# Patient Record
Sex: Female | Born: 1970 | Race: White | Hispanic: No | Smoking: Former smoker
Health system: Southern US, Community
[De-identification: ages and names within clinical notes are randomized; demographics above are authoritative.]

## PROBLEM LIST (undated history)

## (undated) DIAGNOSIS — E079 Disorder of thyroid, unspecified: Secondary | ICD-10-CM

## (undated) DIAGNOSIS — E039 Hypothyroidism, unspecified: Secondary | ICD-10-CM

## (undated) DIAGNOSIS — K219 Gastro-esophageal reflux disease without esophagitis: Secondary | ICD-10-CM

## (undated) DIAGNOSIS — F329 Major depressive disorder, single episode, unspecified: Secondary | ICD-10-CM

## (undated) DIAGNOSIS — J45909 Unspecified asthma, uncomplicated: Secondary | ICD-10-CM

## (undated) DIAGNOSIS — F419 Anxiety disorder, unspecified: Secondary | ICD-10-CM

## (undated) DIAGNOSIS — F32A Depression, unspecified: Secondary | ICD-10-CM

## (undated) HISTORY — PX: ABDOMINAL HYSTERECTOMY: SHX81

## (undated) HISTORY — DX: Depression, unspecified: F32.A

## (undated) HISTORY — DX: Major depressive disorder, single episode, unspecified: F32.9

## (undated) HISTORY — DX: Gastro-esophageal reflux disease without esophagitis: K21.9

## (undated) HISTORY — DX: Anxiety disorder, unspecified: F41.9

---

## 2012-03-18 ENCOUNTER — Other Ambulatory Visit: Payer: Self-pay | Admitting: Obstetrics

## 2012-03-18 DIAGNOSIS — R102 Pelvic and perineal pain: Secondary | ICD-10-CM

## 2012-03-23 ENCOUNTER — Ambulatory Visit (HOSPITAL_COMMUNITY)
Admission: RE | Admit: 2012-03-23 | Discharge: 2012-03-23 | Disposition: A | Discharge status: Home / Self Care | Payer: Self-pay | Source: Ambulatory Visit | Attending: Obstetrics | Admitting: Obstetrics

## 2012-03-23 DIAGNOSIS — N949 Unspecified condition associated with female genital organs and menstrual cycle: Secondary | ICD-10-CM | POA: Insufficient documentation

## 2012-03-23 DIAGNOSIS — N809 Endometriosis, unspecified: Secondary | ICD-10-CM | POA: Insufficient documentation

## 2012-03-23 DIAGNOSIS — R102 Pelvic and perineal pain: Secondary | ICD-10-CM

## 2012-03-23 DIAGNOSIS — I868 Varicose veins of other specified sites: Secondary | ICD-10-CM | POA: Insufficient documentation

## 2016-10-26 ENCOUNTER — Encounter: Payer: Self-pay | Admitting: Primary Care

## 2016-10-26 ENCOUNTER — Ambulatory Visit (INDEPENDENT_AMBULATORY_CARE_PROVIDER_SITE_OTHER): Payer: BLUE CROSS/BLUE SHIELD | Admitting: Primary Care

## 2016-10-26 ENCOUNTER — Ambulatory Visit (INDEPENDENT_AMBULATORY_CARE_PROVIDER_SITE_OTHER)
Admission: RE | Admit: 2016-10-26 | Discharge: 2016-10-26 | Disposition: A | Discharge status: Home / Self Care | Payer: BLUE CROSS/BLUE SHIELD | Source: Ambulatory Visit | Attending: Primary Care | Admitting: Primary Care

## 2016-10-26 VITALS — BP 120/70 | HR 70 | Temp 98.2°F | Ht 66.5 in | Wt 223.8 lb

## 2016-10-26 DIAGNOSIS — M5441 Lumbago with sciatica, right side: Secondary | ICD-10-CM | POA: Diagnosis not present

## 2016-10-26 DIAGNOSIS — F418 Other specified anxiety disorders: Secondary | ICD-10-CM | POA: Diagnosis not present

## 2016-10-26 DIAGNOSIS — F32A Depression, unspecified: Secondary | ICD-10-CM | POA: Insufficient documentation

## 2016-10-26 DIAGNOSIS — F419 Anxiety disorder, unspecified: Secondary | ICD-10-CM

## 2016-10-26 DIAGNOSIS — F329 Major depressive disorder, single episode, unspecified: Secondary | ICD-10-CM

## 2016-10-26 MED ORDER — METHOCARBAMOL 500 MG PO TABS: 500.0000 mg | ORAL_TABLET | Freq: Three times a day (TID) | ORAL | 0 refills | Status: DC | PRN

## 2016-10-26 NOTE — Patient Instructions (Signed)
Complete xray(s) prior to leaving today.   Stop Cyclobenzaprine muscle relaxer. Start methocarbamol (Robaxin) muscle relaxer. Take 1 tablet by mouth three times daily as needed for muscle spasms.  Continue dexamethasone steroid. Do not take any ibuprofen, motrin, aleve, advil while taking this medication.  Apply a heating pad to your lower back when possible.  I will be in touch with you as soon as I receive your xray results.  Please schedule a physical with me sometime in 2018. You may also schedule a lab only appointment 3-4 days prior. We will discuss your lab results in detail during your physical.  It was a pleasure to meet you today! Please don't hesitate to call me with any questions. Welcome to [facility]!   Back Pain, Adult Introduction Back pain is very common. The pain often gets better over time. The cause of back pain is usually not dangerous. Most people can learn to manage their back pain on their own. Follow these instructions at home: Watch your back pain for any changes. The following actions may help to lessen any pain you are feeling:  Stay active. Start with short walks on flat ground if you can. Try to walk farther each day.  Exercise regularly as told by your doctor. Exercise helps your back heal faster. It also helps avoid future injury by keeping your muscles strong and flexible.  Do not sit, drive, or stand in one place for more than 30 minutes.  Do not stay in bed. Resting more than 1-2 days can slow down your recovery.  Be careful when you bend or lift an object. Use good form when lifting:  Bend at your knees.  Keep the object close to your body.  Do not twist.  Sleep on a firm mattress. Lie on your side, and bend your knees. If you lie on your back, put a pillow under your knees.  Take medicines only as told by your doctor.  Put ice on the injured area.  Put ice in a plastic bag.  Place a towel between your skin and the bag.  Leave the ice  on for 20 minutes, 2-3 times a day for the first 2-3 days. After that, you can switch between ice and heat packs.  Avoid feeling anxious or stressed. Find good ways to deal with stress, such as exercise.  Maintain a healthy weight. Extra weight puts stress on your back. Contact a doctor if:  You have pain that does not go away with rest or medicine.  You have worsening pain that goes down into your legs or buttocks.  You have pain that does not get better in one week.  You have pain at night.  You lose weight.  You have a fever or chills. Get help right away if:  You cannot control when you poop (bowel movement) or pee (urinate).  Your arms or legs feel weak.  Your arms or legs lose feeling (numbness).  You feel sick to your stomach (nauseous) or throw up (vomit).  You have belly (abdominal) pain.  You feel like you may pass out (faint). This information is not intended to replace advice given to you by your health care provider. Make sure you discuss any questions you have with your health care provider. Document Released: 03/02/2008 Document Revised: 02/20/2016 Document Reviewed: 01/16/2014  2017 Elsevier

## 2016-10-26 NOTE — Assessment & Plan Note (Addendum)
Acute episode x 2 weeks. Exam today suggestive of muscle spasm that is likely compressing the sciatic nerve. Will have her continue dexamethasone. Switch cyclobenzaprine to methocarbamol.  Discussed application of heat. Xray pending today. Consider PT if no improvement after completion of dexamethasone course. No evidence of weakness.

## 2016-10-26 NOTE — Assessment & Plan Note (Signed)
Long history of, doesn't seem like she was every effectively treated. Offered treatment today, she declines as she does well to manage her symptoms. Will monitor. Denies SI/HI.

## 2016-10-26 NOTE — Progress Notes (Signed)
Subjective:    Patient ID: Sara Stone, female    DOB: 06-01-1971, 46 y.o.   MRN: [id number]  HPI  Sara Stone is a 46 year old female who presents today to establish care and discuss the problems mentioned below. Will obtain old records.  1) Back Pain: Present for the past two weeks. Her pain is located to the right lower back with radiation to her right lower extremity. She's also noticed numbness. She denies recent injury/trauma. Her pain is worse with movement and getting up after sitting for prolonged periods of time. She was evaluated at Urgent Care several days ago for same symptoms. She was prescribed Tylenol #3, cyclobenzaprine 10 mg, dexamethasone 4 mg and hasn't noticed any improvement. She has 5 days remaining of her dexamethasone.   2) Anxiety and Depression: Diagnosed several years ago and once managed on Xanax, then Clonazepam. She never noticed any improvement on those medications. She endorses daily anxiety, irritability, feeling easily overwhelmed, will cry with episodes of frustration. She is able to manage her symptoms on her own without medications. Several years ago she did take a bottle of Tylenol PM in an attempt to end her life. Denies SI/HI recently. She works 12 hour shifts, 5 days weekly.   Review of Systems  Respiratory: Negative for shortness of breath.   Cardiovascular: Negative for chest pain.  Musculoskeletal: Positive for back pain.  Neurological: Positive for numbness. Negative for weakness.  Psychiatric/Behavioral: The patient is nervous/anxious.        Past Medical History:  Diagnosis Date  . Anxiety and depression   . GERD (gastroesophageal reflux disease)      Social History   Social History  . Marital status: Legally Separated    Spouse name: N/A  . Number of children: N/A  . Years of education: N/A   Occupational History  . Not on file.   Social History Main Topics  . Smoking status: Current Every Day Smoker    Packs/day: 0.50  .  Smokeless tobacco: Never Used  . Alcohol use No  . Drug use: Unknown  . Sexual activity: Not on file   Other Topics Concern  . Not on file   Social History Narrative   Single.   1 child.   Works for [employer].   Enjoys relaxing.    Past Surgical History:  Procedure Laterality Date  . ABDOMINAL HYSTERECTOMY      Family History  Problem Relation Age of Onset  . Hypertension Mother   . Hyperlipidemia Mother     Allergies  Allergen Reactions  . Sulfa Antibiotics     No current outpatient prescriptions on file prior to visit.   No current facility-administered medications on file prior to visit.     BP 120/70   Pulse 70   Temp 98.2 F (36.8 C) (Oral)   Ht 5' 6.5" (1.689 m)   Wt 223 lb 12.8 oz (101.5 kg)   LMP 03/07/2012   SpO2 96%   BMI 35.58 kg/m    Objective:   Physical Exam  Constitutional: She appears well-nourished.  Neck: Neck supple.  Cardiovascular: Normal rate and regular rhythm.   Pulmonary/Chest: Effort normal and breath sounds normal.  Musculoskeletal:       Lumbar back: She exhibits decreased range of motion, tenderness, pain and spasm.  Negative straight leg raise bilaterally. Lower extremity strength 5/5 bilaterally.  Skin: Skin is warm and dry.  Psychiatric: She has a normal mood and affect.  Assessment & Plan:

## 2016-10-26 NOTE — Progress Notes (Signed)
Pre visit review using our clinic review tool, if applicable. No additional management support is needed unless otherwise documented below in the visit note. 

## 2016-10-30 ENCOUNTER — Other Ambulatory Visit: Payer: Self-pay

## 2016-10-30 DIAGNOSIS — M5441 Lumbago with sciatica, right side: Secondary | ICD-10-CM

## 2016-10-30 NOTE — Telephone Encounter (Signed)
Pt left v/m pt seen 10/26/16; pt is still in pain and request refill on muscle relaxer robaxin (last refilled # 15 on 10/26/16). Pt also request rx for Tylenol # 3. Pt request cb.

## 2016-11-01 MED ORDER — METHOCARBAMOL 500 MG PO TABS: 500.0000 mg | ORAL_TABLET | Freq: Three times a day (TID) | ORAL | 0 refills | Status: DC | PRN

## 2016-11-01 MED ORDER — ACETAMINOPHEN-CODEINE #3 300-30 MG PO TABS: 1.0000 | ORAL_TABLET | Freq: Four times a day (QID) | ORAL | 0 refills | Status: DC | PRN

## 2016-11-02 NOTE — Telephone Encounter (Signed)
Spoken and notified patient of [name]'s comments. Patient verbalized understanding. 

## 2016-11-02 NOTE — Telephone Encounter (Signed)
Message left for patient to return my call.  

## 2017-05-04 ENCOUNTER — Other Ambulatory Visit (HOSPITAL_BASED_OUTPATIENT_CLINIC_OR_DEPARTMENT_OTHER): Payer: Self-pay | Admitting: Emergency Medicine

## 2017-05-04 DIAGNOSIS — Z1231 Encounter for screening mammogram for malignant neoplasm of breast: Secondary | ICD-10-CM

## 2017-05-06 ENCOUNTER — Ambulatory Visit (HOSPITAL_BASED_OUTPATIENT_CLINIC_OR_DEPARTMENT_OTHER)
Admission: RE | Admit: 2017-05-06 | Discharge: 2017-05-06 | Disposition: A | Discharge status: Home / Self Care | Payer: BLUE CROSS/BLUE SHIELD | Source: Ambulatory Visit | Attending: Emergency Medicine | Admitting: Emergency Medicine

## 2017-05-06 ENCOUNTER — Encounter (HOSPITAL_BASED_OUTPATIENT_CLINIC_OR_DEPARTMENT_OTHER): Payer: Self-pay

## 2017-05-06 DIAGNOSIS — Z1231 Encounter for screening mammogram for malignant neoplasm of breast: Secondary | ICD-10-CM | POA: Insufficient documentation

## 2017-10-15 ENCOUNTER — Ambulatory Visit (HOSPITAL_BASED_OUTPATIENT_CLINIC_OR_DEPARTMENT_OTHER)
Admission: RE | Admit: 2017-10-15 | Discharge: 2017-10-15 | Disposition: A | Discharge status: Home / Self Care | Payer: BLUE CROSS/BLUE SHIELD | Source: Ambulatory Visit | Attending: Osteopathic Medicine | Admitting: Osteopathic Medicine

## 2017-10-15 ENCOUNTER — Other Ambulatory Visit (HOSPITAL_BASED_OUTPATIENT_CLINIC_OR_DEPARTMENT_OTHER): Payer: Self-pay | Admitting: Osteopathic Medicine

## 2017-10-15 DIAGNOSIS — R110 Nausea: Secondary | ICD-10-CM | POA: Insufficient documentation

## 2017-10-15 DIAGNOSIS — R319 Hematuria, unspecified: Secondary | ICD-10-CM

## 2017-10-15 DIAGNOSIS — R1011 Right upper quadrant pain: Secondary | ICD-10-CM | POA: Insufficient documentation

## 2017-10-15 DIAGNOSIS — R109 Unspecified abdominal pain: Secondary | ICD-10-CM

## 2022-01-09 ENCOUNTER — Emergency Department (HOSPITAL_BASED_OUTPATIENT_CLINIC_OR_DEPARTMENT_OTHER): Payer: 59

## 2022-01-09 ENCOUNTER — Other Ambulatory Visit: Payer: Self-pay

## 2022-01-09 ENCOUNTER — Encounter (HOSPITAL_BASED_OUTPATIENT_CLINIC_OR_DEPARTMENT_OTHER): Payer: Self-pay | Admitting: *Deleted

## 2022-01-09 ENCOUNTER — Emergency Department (HOSPITAL_BASED_OUTPATIENT_CLINIC_OR_DEPARTMENT_OTHER)
Admission: EM | Admit: 2022-01-09 | Discharge: 2022-01-09 | Disposition: A | Discharge status: Home / Self Care | Payer: 59 | Attending: Emergency Medicine | Admitting: Emergency Medicine

## 2022-01-09 DIAGNOSIS — R051 Acute cough: Secondary | ICD-10-CM | POA: Diagnosis not present

## 2022-01-09 DIAGNOSIS — R059 Cough, unspecified: Secondary | ICD-10-CM | POA: Diagnosis present

## 2022-01-09 DIAGNOSIS — R0789 Other chest pain: Secondary | ICD-10-CM | POA: Insufficient documentation

## 2022-01-09 DIAGNOSIS — R911 Solitary pulmonary nodule: Secondary | ICD-10-CM | POA: Diagnosis not present

## 2022-01-09 DIAGNOSIS — R918 Other nonspecific abnormal finding of lung field: Secondary | ICD-10-CM

## 2022-01-09 HISTORY — DX: Disorder of thyroid, unspecified: E07.9

## 2022-01-09 LAB — TROPONIN I (HIGH SENSITIVITY): Troponin I (High Sensitivity): 3 ng/L (ref <18)

## 2022-01-09 MED ORDER — FLUTICASONE PROPIONATE 50 MCG/ACT NA SUSP: 2.0000 | Freq: Every day | NASAL | 0 refills | Status: AC

## 2022-01-09 MED ORDER — CETIRIZINE HCL 10 MG PO TABS: 10.0000 mg | ORAL_TABLET | Freq: Every day | ORAL | 0 refills | Status: DC

## 2022-01-09 MED ORDER — PREDNISONE 10 MG (21) PO TBPK: ORAL_TABLET | Freq: Every day | ORAL | 0 refills | Status: DC

## 2022-01-09 MED ORDER — IOHEXOL 350 MG/ML SOLN
100.0000 mL | Freq: Once | INTRAVENOUS | Status: AC | PRN
  Administered 2022-01-09: 75 mL via INTRAVENOUS

## 2022-01-09 MED ORDER — OXYCODONE-ACETAMINOPHEN 5-325 MG PO TABS
1.0000 | ORAL_TABLET | Freq: Once | ORAL | Status: AC
  Administered 2022-01-09: 1 via ORAL
  Filled 2022-01-09: qty 1

## 2022-01-09 NOTE — ED Triage Notes (Signed)
Pt seen by PCP today for chest/back pain. States she was told she has pneumonia and an elevated d-dimer. She was sent here for CT angio ?

## 2022-01-09 NOTE — ED Provider Notes (Signed)
?[facility] EMERGENCY DEPARTMENT ?Provider Note ? ? ?CSN: [id number] ?Arrival date & time: 01/09/22  1622 ? ?  ? ?History ? ?Chief Complaint  ?Patient presents with  ? Abnormal Lab  ? ? ?Chamya Hunton is a 51 y.o. female. ? ? ?Abnormal Lab ? ?Patient is a 51 year old female with a past medical history significant for endometriosis status post hysterectomy, generalized anxiety, chronic low back pain she presented emergency room today with cough for 1 month she denies any mopped assist she states that it is mostly dry but occasionally productive of some clear sputum.  She states she feels she has pain with deep breathing she states it seems to be affecting her left chest is sharp and stabbing pain.  She states she feels short of breath primarily when she is coughing.  She denies any fevers at home.  She states she does not feel short of breath generally.  ? ? ? ? ?  ? ?Home Medications ?Prior to Admission medications   ?Medication Sig Start Date End Date Taking? Authorizing Provider  ?cetirizine (ZYRTEC ALLERGY) 10 MG tablet Take 1 tablet (10 mg total) by mouth daily. 01/09/22  Yes Colbie Danner, Rodrigo Ran, PA  ?fluticasone (FLONASE) 50 MCG/ACT nasal spray Place 2 sprays into both nostrils daily for 14 days. 01/09/22 01/23/22 Yes Khalib Fendley S, PA  ?predniSONE (STERAPRED UNI-PAK 21 TAB) 10 MG (21) TBPK tablet Take by mouth daily. Take 6 tabs by mouth daily  for 2 days, then 5 tabs for 2 days, then 4 tabs for 2 days, then 3 tabs for 2 days, 2 tabs for 2 days, then 1 tab by mouth daily for 2 days 01/09/22  Yes Athena Baltz S, PA  ?acetaminophen-codeine (TYLENOL #3) 300-30 MG tablet Take 1 tablet by mouth every 6 (six) hours as needed for moderate pain. 11/01/16   Doreene Nest, NP  ?dexamethasone (DECADRON) 4 MG tablet Take 3 tablets by mouth once a day for 3 days. Then 2 tablets daily for 3 days. Then 1 tablet daily for 3 days.    [provider]  ?methocarbamol (ROBAXIN) 500 MG tablet Take 1 tablet (500  mg total) by mouth every 8 (eight) hours as needed for muscle spasms. 11/01/16   Doreene Nest, NP  ?   ? ?Allergies    ?Tramadol and Sulfa antibiotics   ? ?Review of Systems   ?Review of Systems ? ?Physical Exam ?Updated Vital Signs ?BP 124/62   Pulse 64   Temp 98.1 ?F (36.7 ?C) (Oral)   Resp (!) 21   Ht 5\' 5"  (1.651 m)   Wt 106 kg   LMP 03/07/2012   SpO2 98%   BMI 38.89 kg/m?  ?Physical Exam ?Vitals and nursing note reviewed.  ?Constitutional:   ?   General: She is not in acute distress. ?HENT:  ?   Head: Normocephalic and atraumatic.  ?   Nose: Nose normal.  ?Eyes:  ?   General: No scleral icterus. ?Cardiovascular:  ?   Rate and Rhythm: Normal rate and regular rhythm.  ?   Pulses: Normal pulses.  ?   Heart sounds: Normal heart sounds.  ?Pulmonary:  ?   Effort: Pulmonary effort is normal. No respiratory distress.  ?   Breath sounds: No wheezing.  ?   Comments: Coarse lung sounds, no wheezing ?Abdominal:  ?   Palpations: Abdomen is soft.  ?   Tenderness: There is no abdominal tenderness. There is no guarding or rebound.  ?Musculoskeletal:  ?  Cervical back: Normal range of motion.  ?   Right lower leg: No edema.  ?   Left lower leg: No edema.  ?Skin: ?   General: Skin is warm and dry.  ?   Capillary Refill: Capillary refill takes less than 2 seconds.  ?Neurological:  ?   Mental Status: She is alert. Mental status is at baseline.  ?Psychiatric:     ?   Mood and Affect: Mood normal.     ?   Behavior: Behavior normal.  ? ? ?ED Results / Procedures / Treatments   ?Labs ?(all labs ordered are listed, but only abnormal results are displayed) ?Labs Reviewed  ?TROPONIN I (HIGH SENSITIVITY)  ? ? ?EKG ?None ? ?Radiology ?CT Angio Chest PE W/Cm &/Or Wo Cm ? ?Result Date: 01/09/2022 ?CLINICAL DATA:  Chest and back pain, elevated D-dimer, pneumonia EXAM: CT ANGIOGRAPHY CHEST WITH CONTRAST TECHNIQUE: Multidetector CT imaging of the chest was performed using the standard protocol during bolus administration of  intravenous contrast. Multiplanar CT image reconstructions and MIPs were obtained to evaluate the vascular anatomy. RADIATION DOSE REDUCTION: This exam was performed according to the departmental dose-optimization program which includes automated exposure control, adjustment of the mA and/or kV according to patient size and/or use of iterative reconstruction technique. CONTRAST:  75mL OMNIPAQUE IOHEXOL 350 MG/ML SOLN COMPARISON:  01/09/2022, 03/19/2019 FINDINGS: Cardiovascular: This is a technically adequate evaluation of the pulmonary vasculature. No filling defects or pulmonary emboli. The heart is unremarkable without pericardial effusion. No evidence of thoracic aortic aneurysm or dissection. Mediastinum/Nodes: No enlarged mediastinal, hilar, or axillary lymph nodes. Thyroid gland, trachea, and esophagus demonstrate no significant findings. Lungs/Pleura: There are bilateral less than 4 mm pulmonary nodules, unchanged since 2014 and consistent with benign nodules. Largest nodules measure 4 mm within the left upper lobe image 31/5 and right lower lobe image 59/5. No acute airspace disease, effusion, or pneumothorax. Minimal subsegmental atelectasis within the lingula. Central airways are widely patent. Upper Abdomen: No acute abnormality. Musculoskeletal: No acute or destructive bony lesions. Reconstructed images demonstrate no additional findings. Review of the MIP images confirms the above findings. IMPRESSION: 1. No acute intrathoracic process. 2. No evidence of pulmonary embolus. 3. Multiple bilateral sub 4 mm pulmonary nodules, unchanged since 2014 and benign. No imaging follow-up is required. 4. Minimal subsegmental atelectasis within the lingula, corresponding to chest x-ray finding. No acute airspace disease. Electronically Signed   By: Sharlet SalinaMichael  Brown M.D.   On: 01/09/2022 19:08   ? ?Procedures ?Procedures  ? ? ?Medications Ordered in ED ?Medications  ?oxyCODONE-acetaminophen (PERCOCET/ROXICET) 5-325 MG  per tablet 1 tablet (1 tablet Oral Given 01/09/22 1756)  ?iohexol (OMNIPAQUE) 350 MG/ML injection 100 mL (75 mLs Intravenous Contrast Given 01/09/22 1844)  ? ? ?ED Course/ Medical Decision Making/ A&P ?Clinical Course as of 01/09/22 1954  ?Fri Jan 09, 2022  ?1741 Pred + ibuprofen for home [WF]  ?  ?Clinical Course User Index ?[WF] Gailen ShelterFondaw, Juelz Claar S, [place]  ? ?                        ?Medical Decision Making ?Amount and/or Complexity of Data Reviewed ?Radiology: ordered. ? ?Risk ?OTC drugs. ?Prescription drug management. ? ? ? ?Patient is 51 year old female sent by PCP office after D-dimer that is elevated for rule out PE ? ?Patient is actually PERC negative and I suspect that her chest pain is related to her now 1 month of coughing.  She has not had any  hemoptysis however with elevated D-dimer in the setting of her chest pain I think it is very reasonable to get a CT PE study. ? ?I added on a troponin given the very atypical sounding chest pain I have low suspicion for this being elevated however will rule out myocarditis. ? ?I personally viewed images of CT PE study which is negative for pulmonary embolism but does show pulmonary nodules.  She is aware of this.  She will follow-up with PCP.  Prednisone, Zyrtec, Flonase prescribed. ? ?Patient is symptoms are consistent with bronchitis.  Will discharge home with appropriate treatment. ? ?Final Clinical Impression(s) / ED Diagnoses ?Final diagnoses:  ?Acute cough  ?Pulmonary nodules  ? ? ?Rx / DC Orders ?ED Discharge Orders   ? ?      Ordered  ?  predniSONE (STERAPRED UNI-PAK 21 TAB) 10 MG (21) TBPK tablet  Daily       ? 01/09/22 1915  ?  cetirizine (ZYRTEC ALLERGY) 10 MG tablet  Daily       ? 01/09/22 1915  ?  fluticasone (FLONASE) 50 MCG/ACT nasal spray  Daily       ? 01/09/22 1915  ? ?  ?  ? ?  ? ? ?  ?Gailen Shelter, [place] ?01/09/22 1956 ? ?  ?Gwyneth Sprout, MD ?01/10/22 2356 ? ?

## 2022-01-09 NOTE — Discharge Instructions (Addendum)
Your CT scan was without any evidence of pneumonia or blood clots.  There were some pulmonary nodules found however these do not seem to have changed in size from prior images of your lungs ?I am sending you in a prescription for prednisone.  Please take as prescribed.  Plenty of water and read the instructions below that are general instructions for viral illnesses ? ?Viral Illness ?TREATMENT  ?Treatment is directed at relieving symptoms. There is no cure. Antibiotics are not effective, because the infection is caused by a virus, not by bacteria. Treatment may include:  ?Increased fluid intake. Sports drinks offer valuable electrolytes, sugars, and fluids.  ?Breathing heated mist or steam (vaporizer or shower).  ?Eating chicken soup or other clear broths, and maintaining good nutrition.  ?Getting plenty of rest.  ?Using gargles or lozenges for comfort.  ?Increasing usage of your inhaler if you have asthma.  ?Return to work when your temperature has returned to normal.  ?Gargle warm salt water and spit it out for sore throat. Take benadryl to decrease sinus secretions. Continue to alternate between Tylenol and ibuprofen for pain and fever control. ? ?Follow Up: Follow up with your primary care doctor in 5-7 days for recheck of ongoing symptoms.  Return to emergency department for emergent changing or worsening of symptoms.  ?

## 2022-02-16 ENCOUNTER — Encounter (HOSPITAL_BASED_OUTPATIENT_CLINIC_OR_DEPARTMENT_OTHER): Payer: Self-pay | Admitting: Surgery

## 2022-02-16 ENCOUNTER — Other Ambulatory Visit: Payer: Self-pay

## 2022-02-24 ENCOUNTER — Ambulatory Visit: Payer: Self-pay | Admitting: Surgery

## 2022-02-24 NOTE — Anesthesia Preprocedure Evaluation (Signed)
Anesthesia Evaluation  Patient identified by MRN, date of birth, ID band Patient awake    Reviewed: Allergy & Precautions, NPO status , Patient's Chart, lab work & pertinent test results  Airway Mallampati: II  TM Distance: >3 FB Neck ROM: Full    Dental no notable dental hx.    Pulmonary asthma , Current Smoker (vapes), former smoker,    Pulmonary exam normal breath sounds clear to auscultation       Cardiovascular Exercise Tolerance: Good negative cardio ROS Normal cardiovascular exam Rhythm:Regular Rate:Normal     Neuro/Psych PSYCHIATRIC DISORDERS Anxiety Depression  Neuromuscular disease (sciatica)    GI/Hepatic Neg liver ROS, GERD  ,  Endo/Other  Hypothyroidism obesity  Renal/GU negative Renal ROS  negative genitourinary   Musculoskeletal negative musculoskeletal ROS (+)   Abdominal (+) + obese,   Peds negative pediatric ROS (+)  Hematology negative hematology ROS (+)   Anesthesia Other Findings   Reproductive/Obstetrics negative OB ROS                           Anesthesia Physical Anesthesia Plan  ASA: 3  Anesthesia Plan: General   Post-op Pain Management: Tylenol PO (pre-op)*   Induction: Intravenous  PONV Risk Score and Plan: 3 and Scopolamine patch - Pre-op, Treatment may vary due to age or medical condition, Midazolam, Dexamethasone and Ondansetron  Airway Management Planned: LMA  Additional Equipment:   Intra-op Plan:   Post-operative Plan: Extubation in OR  Informed Consent: I have reviewed the patients History and Physical, chart, labs and discussed the procedure including the risks, benefits and alternatives for the proposed anesthesia with the patient or authorized representative who has indicated his/her understanding and acceptance.     Dental advisory given  Plan Discussed with: CRNA, Anesthesiologist and Surgeon  Anesthesia Plan Comments:          Anesthesia Quick Evaluation

## 2022-02-25 ENCOUNTER — Ambulatory Visit (HOSPITAL_BASED_OUTPATIENT_CLINIC_OR_DEPARTMENT_OTHER): Payer: 59 | Admitting: Anesthesiology

## 2022-02-25 ENCOUNTER — Other Ambulatory Visit: Payer: Self-pay

## 2022-02-25 ENCOUNTER — Encounter (HOSPITAL_BASED_OUTPATIENT_CLINIC_OR_DEPARTMENT_OTHER): Payer: Self-pay | Admitting: Surgery

## 2022-02-25 ENCOUNTER — Ambulatory Visit (HOSPITAL_BASED_OUTPATIENT_CLINIC_OR_DEPARTMENT_OTHER)
Admission: RE | Admit: 2022-02-25 | Discharge: 2022-02-25 | Disposition: A | Discharge status: Home / Self Care | Payer: 59 | Attending: Surgery | Admitting: Surgery

## 2022-02-25 ENCOUNTER — Encounter (HOSPITAL_BASED_OUTPATIENT_CLINIC_OR_DEPARTMENT_OTHER)
Admission: RE | Disposition: A | Discharge status: Home / Self Care | Payer: Self-pay | Source: Home / Self Care | Attending: Surgery

## 2022-02-25 DIAGNOSIS — E669 Obesity, unspecified: Secondary | ICD-10-CM | POA: Insufficient documentation

## 2022-02-25 DIAGNOSIS — G709 Myoneural disorder, unspecified: Secondary | ICD-10-CM

## 2022-02-25 DIAGNOSIS — L729 Follicular cyst of the skin and subcutaneous tissue, unspecified: Secondary | ICD-10-CM | POA: Diagnosis not present

## 2022-02-25 DIAGNOSIS — F418 Other specified anxiety disorders: Secondary | ICD-10-CM | POA: Diagnosis not present

## 2022-02-25 DIAGNOSIS — F17290 Nicotine dependence, other tobacco product, uncomplicated: Secondary | ICD-10-CM | POA: Diagnosis not present

## 2022-02-25 DIAGNOSIS — E039 Hypothyroidism, unspecified: Secondary | ICD-10-CM | POA: Diagnosis not present

## 2022-02-25 DIAGNOSIS — K219 Gastro-esophageal reflux disease without esophagitis: Secondary | ICD-10-CM | POA: Insufficient documentation

## 2022-02-25 DIAGNOSIS — Z6837 Body mass index (BMI) 37.0-37.9, adult: Secondary | ICD-10-CM | POA: Diagnosis not present

## 2022-02-25 DIAGNOSIS — Z01818 Encounter for other preprocedural examination: Secondary | ICD-10-CM

## 2022-02-25 DIAGNOSIS — L720 Epidermal cyst: Secondary | ICD-10-CM | POA: Diagnosis not present

## 2022-02-25 HISTORY — DX: Hypothyroidism, unspecified: E03.9

## 2022-02-25 HISTORY — DX: Unspecified asthma, uncomplicated: J45.909

## 2022-02-25 HISTORY — PX: MASS EXCISION: SHX2000

## 2022-02-25 SURGERY — EXCISION MASS
Anesthesia: General | Site: Groin | Laterality: Right

## 2022-02-25 MED ORDER — CEFAZOLIN SODIUM-DEXTROSE 2-4 GM/100ML-% IV SOLN
INTRAVENOUS | Status: AC
  Filled 2022-02-25: qty 100

## 2022-02-25 MED ORDER — PROPOFOL 10 MG/ML IV BOLUS
INTRAVENOUS | Status: AC
Start: 2022-02-25 — End: ?
  Filled 2022-02-25: qty 20

## 2022-02-25 MED ORDER — LACTATED RINGERS IV SOLN: INTRAVENOUS | Status: DC

## 2022-02-25 MED ORDER — SCOPOLAMINE 1 MG/3DAYS TD PT72
MEDICATED_PATCH | TRANSDERMAL | Status: AC
  Filled 2022-02-25: qty 1

## 2022-02-25 MED ORDER — ACETAMINOPHEN 500 MG PO TABS
ORAL_TABLET | ORAL | Status: AC
  Filled 2022-02-25: qty 2

## 2022-02-25 MED ORDER — CEFAZOLIN SODIUM-DEXTROSE 2-4 GM/100ML-% IV SOLN
2.0000 g | INTRAVENOUS | Status: AC
  Administered 2022-02-25: 2 g via INTRAVENOUS

## 2022-02-25 MED ORDER — LIDOCAINE 2% (20 MG/ML) 5 ML SYRINGE
INTRAMUSCULAR | Status: AC
  Filled 2022-02-25: qty 5

## 2022-02-25 MED ORDER — CHLORHEXIDINE GLUCONATE CLOTH 2 % EX PADS
6.0000 | MEDICATED_PAD | Freq: Once | CUTANEOUS | Status: DC
Start: 2022-02-25 — End: 2022-02-25

## 2022-02-25 MED ORDER — AMISULPRIDE (ANTIEMETIC) 5 MG/2ML IV SOLN: 10.0000 mg | Freq: Once | INTRAVENOUS | Status: DC | PRN

## 2022-02-25 MED ORDER — IBUPROFEN 800 MG PO TABS: 800.0000 mg | ORAL_TABLET | Freq: Three times a day (TID) | ORAL | 0 refills | Status: AC | PRN

## 2022-02-25 MED ORDER — MIDAZOLAM HCL 2 MG/2ML IJ SOLN
INTRAMUSCULAR | Status: AC
  Filled 2022-02-25: qty 2

## 2022-02-25 MED ORDER — 0.9 % SODIUM CHLORIDE (POUR BTL) OPTIME
TOPICAL | Status: DC | PRN
  Administered 2022-02-25: 1000 mL

## 2022-02-25 MED ORDER — CHLORHEXIDINE GLUCONATE CLOTH 2 % EX PADS: 6.0000 | MEDICATED_PAD | Freq: Once | CUTANEOUS | Status: DC

## 2022-02-25 MED ORDER — BUPIVACAINE-EPINEPHRINE 0.25% -1:200000 IJ SOLN
INTRAMUSCULAR | Status: DC | PRN
  Administered 2022-02-25: 15 mL

## 2022-02-25 MED ORDER — FENTANYL CITRATE (PF) 100 MCG/2ML IJ SOLN
INTRAMUSCULAR | Status: AC
  Filled 2022-02-25: qty 2

## 2022-02-25 MED ORDER — OXYCODONE HCL 5 MG/5ML PO SOLN: 5.0000 mg | Freq: Once | ORAL | Status: DC | PRN

## 2022-02-25 MED ORDER — BUPIVACAINE-EPINEPHRINE (PF) 0.25% -1:200000 IJ SOLN
INTRAMUSCULAR | Status: AC
  Filled 2022-02-25: qty 30

## 2022-02-25 MED ORDER — OXYCODONE HCL 5 MG PO TABS: 5.0000 mg | ORAL_TABLET | Freq: Four times a day (QID) | ORAL | 0 refills | Status: AC | PRN

## 2022-02-25 MED ORDER — LIDOCAINE HCL 1 % IJ SOLN
INTRAMUSCULAR | Status: DC | PRN
  Administered 2022-02-25: 60 mg via INTRADERMAL

## 2022-02-25 MED ORDER — MIDAZOLAM HCL 5 MG/5ML IJ SOLN
INTRAMUSCULAR | Status: DC | PRN
  Administered 2022-02-25 (×2): 1 mg via INTRAVENOUS

## 2022-02-25 MED ORDER — FENTANYL CITRATE (PF) 100 MCG/2ML IJ SOLN: 25.0000 ug | INTRAMUSCULAR | Status: DC | PRN

## 2022-02-25 MED ORDER — OXYCODONE HCL 5 MG PO TABS: 5.0000 mg | ORAL_TABLET | Freq: Once | ORAL | Status: DC | PRN

## 2022-02-25 MED ORDER — DEXAMETHASONE SODIUM PHOSPHATE 10 MG/ML IJ SOLN
INTRAMUSCULAR | Status: DC | PRN
  Administered 2022-02-25: 8 mg via INTRAVENOUS

## 2022-02-25 MED ORDER — ACETAMINOPHEN 500 MG PO TABS
1000.0000 mg | ORAL_TABLET | Freq: Once | ORAL | Status: AC
  Administered 2022-02-25: 1000 mg via ORAL

## 2022-02-25 MED ORDER — DEXAMETHASONE SODIUM PHOSPHATE 10 MG/ML IJ SOLN
INTRAMUSCULAR | Status: AC
  Filled 2022-02-25: qty 1

## 2022-02-25 MED ORDER — ONDANSETRON HCL 4 MG/2ML IJ SOLN
INTRAMUSCULAR | Status: DC | PRN
  Administered 2022-02-25: 4 mg via INTRAVENOUS

## 2022-02-25 MED ORDER — PROPOFOL 10 MG/ML IV BOLUS
INTRAVENOUS | Status: DC | PRN
  Administered 2022-02-25: 200 mg via INTRAVENOUS

## 2022-02-25 MED ORDER — ONDANSETRON HCL 4 MG/2ML IJ SOLN: 4.0000 mg | Freq: Once | INTRAMUSCULAR | Status: DC | PRN

## 2022-02-25 MED ORDER — SCOPOLAMINE 1 MG/3DAYS TD PT72
1.0000 | MEDICATED_PATCH | TRANSDERMAL | Status: DC
  Administered 2022-02-25: 1.5 mg via TRANSDERMAL

## 2022-02-25 MED ORDER — FENTANYL CITRATE (PF) 100 MCG/2ML IJ SOLN
INTRAMUSCULAR | Status: DC | PRN
Start: 2022-02-25 — End: 2022-02-25
  Administered 2022-02-25 (×2): 50 ug via INTRAVENOUS

## 2022-02-25 MED ORDER — ONDANSETRON HCL 4 MG/2ML IJ SOLN
INTRAMUSCULAR | Status: AC
  Filled 2022-02-25: qty 2

## 2022-02-25 SURGICAL SUPPLY — 43 items
ADH SKN CLS APL DERMABOND .7 (GAUZE/BANDAGES/DRESSINGS) ×1
APL PRP STRL LF DISP 70% ISPRP (MISCELLANEOUS) ×1
APL SKNCLS STERI-STRIP NONHPOA (GAUZE/BANDAGES/DRESSINGS)
BENZOIN TINCTURE PRP APPL 2/3 (GAUZE/BANDAGES/DRESSINGS) IMPLANT
BLADE SURG 10 STRL SS (BLADE) IMPLANT
BLADE SURG 15 STRL LF DISP TIS (BLADE) ×1 IMPLANT
BLADE SURG 15 STRL SS (BLADE) ×2
BNDG ELASTIC 4X5.8 VLCR STR LF (GAUZE/BANDAGES/DRESSINGS) IMPLANT
CANISTER SUCT 1200ML W/VALVE (MISCELLANEOUS) ×1 IMPLANT
CHLORAPREP W/TINT 26 (MISCELLANEOUS) ×2 IMPLANT
COVER BACK TABLE 60X90IN (DRAPES) ×2 IMPLANT
COVER MAYO STAND STRL (DRAPES) ×2 IMPLANT
DERMABOND ADVANCED (GAUZE/BANDAGES/DRESSINGS) ×1
DERMABOND ADVANCED .7 DNX12 (GAUZE/BANDAGES/DRESSINGS) IMPLANT
DRAPE LAPAROTOMY 100X72 PEDS (DRAPES) ×2 IMPLANT
DRAPE UTILITY XL STRL (DRAPES) ×2 IMPLANT
ELECT COATED BLADE 2.86 ST (ELECTRODE) ×2 IMPLANT
ELECT REM PT RETURN 9FT ADLT (ELECTROSURGICAL) ×2
ELECTRODE REM PT RTRN 9FT ADLT (ELECTROSURGICAL) ×1 IMPLANT
GLOVE BIOGEL PI IND STRL 8 (GLOVE) ×1 IMPLANT
GLOVE BIOGEL PI INDICATOR 8 (GLOVE) ×1
GLOVE ECLIPSE 8.0 STRL XLNG CF (GLOVE) ×2 IMPLANT
GOWN STRL REUS W/ TWL LRG LVL3 (GOWN DISPOSABLE) ×2 IMPLANT
GOWN STRL REUS W/ TWL XL LVL3 (GOWN DISPOSABLE) ×1 IMPLANT
GOWN STRL REUS W/TWL LRG LVL3 (GOWN DISPOSABLE) ×2
GOWN STRL REUS W/TWL XL LVL3 (GOWN DISPOSABLE) ×2
NDL HYPO 25X1 1.5 SAFETY (NEEDLE) ×1 IMPLANT
NEEDLE HYPO 25X1 1.5 SAFETY (NEEDLE) ×2 IMPLANT
NS IRRIG 1000ML POUR BTL (IV SOLUTION) ×1 IMPLANT
PACK BASIN DAY SURGERY FS (CUSTOM PROCEDURE TRAY) ×2 IMPLANT
PENCIL SMOKE EVACUATOR (MISCELLANEOUS) ×2 IMPLANT
SLEEVE SCD COMPRESS KNEE MED (STOCKING) ×2 IMPLANT
SPIKE FLUID TRANSFER (MISCELLANEOUS) IMPLANT
SPONGE T-LAP 4X18 ~~LOC~~+RFID (SPONGE) ×1 IMPLANT
STAPLER VISISTAT 35W (STAPLE) IMPLANT
STRIP CLOSURE SKIN 1/2X4 (GAUZE/BANDAGES/DRESSINGS) IMPLANT
SUT MON AB 4-0 PC3 18 (SUTURE) ×2 IMPLANT
SUT VICRYL 3-0 CR8 SH (SUTURE) ×2 IMPLANT
SUT VICRYL AB 3 0 TIES (SUTURE) IMPLANT
SYR CONTROL 10ML LL (SYRINGE) ×2 IMPLANT
TOWEL GREEN STERILE FF (TOWEL DISPOSABLE) ×4 IMPLANT
TUBE CONNECTING 20X1/4 (TUBING) ×1 IMPLANT
YANKAUER SUCT BULB TIP NO VENT (SUCTIONS) ×1 IMPLANT

## 2022-02-25 NOTE — Interval H&P Note (Signed)
History and Physical Interval Note:  02/25/2022 8:50 AM  Sara Stone  has presented today for surgery, with the diagnosis of CYST RIGHT GROIN.  The various methods of treatment have been discussed with the patient and family. After consideration of risks, benefits and other options for treatment, the patient has consented to  Procedure(s): EXCISION RIGHT GROIN MASS/CYST (Right) as a surgical intervention.  The patient's history has been reviewed, patient examined, no change in status, stable for surgery.  I have reviewed the patient's chart and labs.  Questions were answered to the patient's satisfaction.     Dortha Schwalbe MD The procedure has been discussed with the patient.  Alternative therapies have been discussed with the patient.  Operative risks include bleeding,  Infection,  Organ injury,  Nerve injury,  Blood vessel injury,  DVT,  Pulmonary embolism,  Death,  And possible reoperation.  Medical management risks include worsening of present situation.  The success of the procedure is 50 -90 % at treating patients symptoms.  The patient understands and agrees to proceed.

## 2022-02-25 NOTE — Transfer of Care (Signed)
Immediate Anesthesia Transfer of Care Note  Patient: Sara Stone  Procedure(s) Performed: EXCISION RIGHT GROIN MASS/CYST (Right: Groin)  Patient Location: PACU  Anesthesia Type:General  Level of Consciousness: awake, alert , oriented and patient cooperative  Airway & Oxygen Therapy: Patient Spontanous Breathing and Patient connected to face mask oxygen  Post-op Assessment: Report given to RN and Post -op Vital signs reviewed and stable  Post vital signs: Reviewed and stable  Last Vitals:  Vitals Value Taken Time  BP    Temp 36.6 C 02/25/22 0959  Pulse 67 02/25/22 0959  Resp 19 02/25/22 0959  SpO2 99 % 02/25/22 0959    Last Pain:  Vitals:   02/25/22 0731  TempSrc: Oral  PainSc: 0-No pain      Patients Stated Pain Goal: 3 (02/25/22 0731)  Complications: No notable events documented.

## 2022-02-25 NOTE — Op Note (Signed)
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  This is not resolved for many years and multiple infections in the past.  She presents for excision today.The procedure has been discussed with the patient.  Alternative therapies have been discussed with the patient.  Operative risks include bleeding,  Infection,  Organ injury,  Nerve injury,  Blood vessel injury,  DVT,  Pulmonary embolism,  Death,  And possible reoperation.  Medical management risks include worsening of present situation.  The success of the procedure is 50 -90 % at treating patients symptoms.  The patient understands and agrees to proceed.   Description of procedure: The patient was met in the holding area and questions were answered.  The site was marked with the assistance of the patient in her right groin.  She was then taken back to the operative room.  She was placed upon upon the OR table.  After induction of LMA anesthesia, right groin was prepped and draped in sterile fashion timeout performed.  The area was infiltrated with local anesthetic.  A curvilinear incision was made above and below the cyst in the right groin.  All tissue below the cyst was excised to include the subcutaneous fat to a clear margin with no residual inflammatory tissue or cyst.  This measured 6 cm x 3 cm.  Irrigation was used.  Local anesthetic was infiltrated throughout the wound.  The wound was then closed with a deep layer 3-0 Vicryl.  4 Monocryl was used to close the skin in a subcuticular fashion.  Dermabond applied.   Dry dressings placed.  Ace wrap wrapped snugly around the site.  All counts were correct.  The patient was then awoke extubated taken to recovery in satisfactory condition.

## 2022-02-25 NOTE — Discharge Instructions (Addendum)
Post Anesthesia Home Care Instructions  Activity: Get plenty of rest for the remainder of the day. A responsible individual must stay with you for 24 hours following the procedure.  For the next 24 hours, DO NOT: -Drive a car -Advertising copywriter -Drink alcoholic beverages -Take any medication unless instructed by your physician -Make any legal decisions or sign important papers.  Meals: Start with liquid foods such as gelatin or soup. Progress to regular foods as tolerated. Avoid greasy, spicy, heavy foods. If nausea and/or vomiting occur, drink only clear liquids until the nausea and/or vomiting subsides. Call your physician if vomiting continues.  Special Instructions/Symptoms: Your throat may feel dry or sore from the anesthesia or the breathing tube placed in your throat during surgery. If this causes discomfort, gargle with warm salt water. The discomfort should disappear within 24 hours.  If you had a scopolamine patch placed behind your ear for the management of post- operative nausea and/or vomiting:  1. The medication in the patch is effective for 72 hours, after which it should be removed.  Wrap patch in a tissue and discard in the trash. Wash hands thoroughly with soap and water. 2. You may remove the patch earlier than 72 hours if you experience unpleasant side effects which may include dry mouth, dizziness or visual disturbances. 3. Avoid touching the patch. Wash your hands with soap and water after contact with the patch.      Next dose of Tylenol can be given at 1:30pm if needed.      #######################################################  GENERAL SURGERY: POST OP INSTRUCTIONS  ######################################################################  EAT Gradually transition to a high fiber diet with a fiber supplement over the next few weeks after discharge.  Start with a pureed / full liquid diet (see below)  WALK Walk an hour a day.  Control your pain to do  that.    CONTROL PAIN Control pain so that you can walk, sleep, tolerate sneezing/coughing, go up/down stairs.  HAVE A BOWEL MOVEMENT DAILY Keep your bowels regular to avoid problems.  OK to try a laxative to override constipation.  OK to use an antidairrheal to slow down diarrhea.  Call if not better after 2 tries  CALL IF YOU HAVE PROBLEMS/CONCERNS Call if you are still struggling despite following these instructions. Call if you have concerns not answered by these instructions  ######################################################################    DIET: Follow a light bland diet & liquids the first 24 hours after arrival home, such as soup, liquids, starches, etc.  Be sure to drink plenty of fluids.  Quickly advance to a usual solid diet within a few days.  Avoid fast food or heavy meals as your are more likely to get nauseated or have irregular bowels.  A low-fat, high-fiber diet for the rest of your life is ideal.    Take your usually prescribed home medications unless otherwise directed.  PAIN CONTROL: Pain is best controlled by a usual combination of three different methods TOGETHER: Ice/Heat Over the counter pain medication Prescription pain medication Most patients will experience some swelling and bruising around the incisions.  Ice packs or heating pads (30-60 minutes up to 6 times a day) will help. Use ice for the first few days to help decrease swelling and bruising, then switch to heat to help relax tight/sore spots and speed recovery.  Some people prefer to use ice alone, heat alone, alternating between ice & heat.  Experiment to what works for you.  Swelling and bruising can take several weeks to  resolve.   It is helpful to take an over-the-counter pain medication regularly for the first few weeks.  Choose one of the following that works best for you: Naproxen (Aleve, etc)  Two 220mg  tabs twice a day Ibuprofen (Advil, etc) Three 200mg  tabs four times a day (every meal &  bedtime) Acetaminophen (Tylenol, etc) 500-650mg  four times a day (every meal & bedtime) A  prescription for pain medication (such as oxycodone, hydrocodone, etc) should be given to you upon discharge.  Take your pain medication as prescribed.  If you are having problems/concerns with the prescription medicine (does not control pain, nausea, vomiting, rash, itching, etc), please call us [phone] to see if we need to switch you to a different pain medicine that will work better for you and/or control your side effect better. If you need a refill on your pain medication, please contact your pharmacy.  They will contact our office to request authorization. Prescriptions will not be filled after 5 pm or on week-ends.  Avoid getting constipated.  Between the surgery and the pain medications, it is common to experience some constipation.  Increasing fluid intake and taking a fiber supplement (such as Metamucil, Citrucel, FiberCon, MiraLax, etc) 1-2 times a day regularly will usually help prevent this problem from occurring.  A mild laxative (prune juice, Milk of Magnesia, MiraLax, etc) should be taken according to package directions if there are no bowel movements after 48 hours.   Watch out for diarrhea.  If you have many loose bowel movements, simplify your diet to bland foods & liquids for a few days.  Stop any stool softeners and decrease your fiber supplement.  Switching to mild anti-diarrheal medications (Loperamide/Imodium, Kayopectate, Pepto Bismol) can help.  If this worsens or does not improve, please call us.  Wash / shower every day.  You may shower over the dressings as they are waterproof.  Continue to shower over incision(s) after the dressing is off. Remove your waterproof bandages 5 days after surgery.  You may leave the incision open to air.  You may have skin tapes (Steri Strips) covering the incision(s).  Leave them on until one week, then remove.  You may replace a dressing/Band-Aid to  cover the incision for comfort if you wish.   ACTIVITIES as tolerated:   You may resume regular (light) daily activities beginning the next day--such as daily self-care, walking, climbing stairs--gradually increasing activities as tolerated.  If you can walk 30 minutes without difficulty, it is safe to try more intense activity such as jogging, treadmill, bicycling, low-impact aerobics, swimming, etc. Save the most intensive and strenuous activity for last such as sit-ups, heavy lifting, contact sports, etc  Refrain from any heavy lifting or straining until you are off narcotics for pain control.   DO NOT PUSH THROUGH PAIN.  Let pain be your guide: If it hurts to do something, don't do it.  Pain is your body warning you to avoid that activity for another week until the pain goes down. You may drive when you are no longer taking prescription pain medication, you can comfortably wear a seatbelt, and you can safely maneuver your car and apply brakes. You may have sexual intercourse when it is comfortable.   FOLLOW UP in our office Please call [facility] at [phone] to set up an appointment to see your surgeon in the office for a follow-up appointment approximately 2-3 weeks after your surgery. Make sure that you call for this appointment the day you arrive  home to insure a convenient appointment time.  9. IF YOU HAVE DISABILITY OR FAMILY LEAVE FORMS, BRING THEM TO THE OFFICE FOR PROCESSING.  DO NOT GIVE THEM TO YOUR DOCTOR.   WHEN TO CALL us [phone]: Poor pain control Reactions / problems with new medications (rash/itching, nausea, etc)  Fever over 101.5 F (38.5 C) Worsening swelling or bruising Continued bleeding from incision. Increased pain, redness, or drainage from the incision Difficulty breathing / swallowing   The clinic staff is available to answer your questions during regular business hours (8:30am-5pm).  Please don't hesitate to call and ask to speak to one of our nurses for  clinical concerns.   If you have a medical emergency, go to the nearest emergency room or call 911.  A surgeon from [facility] is always on call at the [facility], [place] [address], [address], [place], [place]  [address] ? MAIN: [phone] ? TOLL FREE: [phone] ?  FAX [phone] [email or web address] REMOVE ACE WRAP TOMORROW AND CAN LEAVE UNWRAPPED  APPLY ICE PACKS 3 - 4 TIMES DAILY FOR 20 MINUTES AT A TIME  #######################################################

## 2022-02-25 NOTE — H&P (Signed)
Chief Complaint: New Consultation   History of Present Illness: Sara Stone is a 51 y.o. female who is seen today as an office consultation for evaluation of New Consultation .   Patient presents for evaluation of a chronically draining area in her right groin. This is been noted for a number of months. She has had multiple infections in this area that resolved but now she is draining regions that are open in the right groin. No fever or chills.  Review of Systems: A complete review of systems was obtained from the patient. I have reviewed this information and discussed as appropriate with the patient. See HPI as well for other ROS.    Medical History: Past Medical History:  Diagnosis Date   Anxiety   Thyroid disease   There is no problem list on file for this patient.  Past Surgical History:  Procedure Laterality Date   HYSTERECTOMY    Allergies  Allergen Reactions   Sulfa (Sulfonamide Antibiotics) Other (See Comments) and Swelling  Tongue swelling    Tramadol Other (See Comments)  'stomach burns Other reaction(s): Other (see comments) 'stomach burns 'stomach burns 'stomach burns   Current Outpatient Medications on File Prior to Visit  Medication Sig Dispense Refill   ALPRAZolam (XANAX) 0.25 MG tablet TAKE 1 TABLET BY MOUTH THREE TIMES DAILY FOR ANXIETY   DULoxetine (CYMBALTA) 60 MG DR capsule Take 60 mg by mouth once daily   levothyroxine (SYNTHROID) 50 MCG tablet Take 50 mcg by mouth once daily   omeprazole (PRILOSEC) 20 MG DR capsule Take 20 mg by mouth once daily   No current facility-administered medications on file prior to visit.   Family History  Problem Relation Age of Onset   Obesity Mother   Diabetes Mother    Social History   Tobacco Use  Smoking Status Every Day   Types: Cigarettes  Smokeless Tobacco Never    Social History   Socioeconomic History   Marital status: Divorced  Tobacco Use   Smoking status: Every Day  Types:  Cigarettes   Smokeless tobacco: Never   Objective:   Vitals:  02/09/22 1341  BP: 138/88  Pulse: 77  Weight: (!) 106.3 kg (234 lb 6.4 oz)  Height: 167.6 cm (5\' 6" )   Body mass index is 37.83 kg/m.  Physical Exam Cardiovascular:  Rate and Rhythm: Normal rate.  Pulmonary:  Effort: Pulmonary effort is normal.  Skin:   Comments: 3 cm x 3 cm area of chronic folliculitis/cyst with draining sinus tract noted. No redness or signs of active infection.  Neurological:  General: No focal deficit present.  Mental Status: She is alert.  Psychiatric:  Mood and Affect: Mood normal.  Behavior: Behavior normal.     Assessment and Plan:   Diagnoses and all orders for this visit:  Chronic folliculitis Comments: right groin    Patient evidence of chronic folliculitis without signs of acute folliculitis. She has what appeared to be small cystic areas in the right groin measuring 3 cm x 3 cm involving skin and subcutaneous tissues. This is chronically draining. This requires excision to help it heal. I discussed excision of this as an outpatient. Risks and benefits of surgery well as complications of bleeding, infection, poor wound healing, recurrence, and the need for the treatment standard procedures discussed with the patient. Anesthesia was also discussed with the patient. She wishes to proceed with surgical excision.  No follow-ups on file.  , MD

## 2022-02-25 NOTE — Anesthesia Postprocedure Evaluation (Signed)
Anesthesia Post Note  Patient: Sara Stone  Procedure(s) Performed: EXCISION RIGHT GROIN MASS/CYST (Right: Groin)     Patient location during evaluation: PACU Anesthesia Type: General Level of consciousness: awake Pain management: pain level controlled Vital Signs Assessment: post-procedure vital signs reviewed and stable Respiratory status: spontaneous breathing and respiratory function stable Cardiovascular status: stable Postop Assessment: no apparent nausea or vomiting Anesthetic complications: no   No notable events documented.  Last Vitals:  Vitals:   02/25/22 1015 02/25/22 1025  BP: 119/73 134/87  Pulse: 60 68  Resp: 13 18  Temp:  36.7 C  SpO2: 94% 95%    Last Pain:  Vitals:   02/25/22 1025  TempSrc:   PainSc: 3                  Candra R Almer [place]

## 2022-02-25 NOTE — Anesthesia Procedure Notes (Signed)
Procedure Name: LMA Insertion Date/Time: 02/25/2022 9:14 AM Performed by: Garrel Ridgel, CRNA Pre-anesthesia Checklist: Patient identified, Emergency Drugs available, Suction available and Patient being monitored Patient Re-evaluated:Patient Re-evaluated prior to induction Oxygen Delivery Method: Circle system utilized Preoxygenation: Pre-oxygenation with 100% oxygen Induction Type: IV induction Ventilation: Mask ventilation without difficulty LMA: LMA inserted LMA Size: 4.0 Number of attempts: 1 Placement Confirmation: positive ETCO2 Tube secured with: Tape Dental Injury: Teeth and Oropharynx as per pre-operative assessment

## 2022-02-26 ENCOUNTER — Encounter (HOSPITAL_BASED_OUTPATIENT_CLINIC_OR_DEPARTMENT_OTHER): Payer: Self-pay | Admitting: Surgery

## 2022-02-27 LAB — SURGICAL PATHOLOGY

## 2022-03-05 ENCOUNTER — Encounter: Payer: Self-pay | Admitting: Surgery

## 2022-03-12 ENCOUNTER — Other Ambulatory Visit: Payer: Self-pay | Admitting: Cardiovascular Disease

## 2022-04-28 ENCOUNTER — Emergency Department (HOSPITAL_BASED_OUTPATIENT_CLINIC_OR_DEPARTMENT_OTHER): Payer: 59

## 2022-04-28 ENCOUNTER — Encounter (HOSPITAL_BASED_OUTPATIENT_CLINIC_OR_DEPARTMENT_OTHER): Payer: Self-pay | Admitting: Emergency Medicine

## 2022-04-28 ENCOUNTER — Other Ambulatory Visit: Payer: Self-pay

## 2022-04-28 ENCOUNTER — Emergency Department (HOSPITAL_BASED_OUTPATIENT_CLINIC_OR_DEPARTMENT_OTHER)
Admission: EM | Admit: 2022-04-28 | Discharge: 2022-04-28 | Disposition: A | Discharge status: Home / Self Care | Payer: 59 | Attending: Emergency Medicine | Admitting: Emergency Medicine

## 2022-04-28 DIAGNOSIS — M7989 Other specified soft tissue disorders: Secondary | ICD-10-CM | POA: Diagnosis not present

## 2022-04-28 DIAGNOSIS — S63501A Unspecified sprain of right wrist, initial encounter: Secondary | ICD-10-CM | POA: Insufficient documentation

## 2022-04-28 DIAGNOSIS — E039 Hypothyroidism, unspecified: Secondary | ICD-10-CM | POA: Insufficient documentation

## 2022-04-28 DIAGNOSIS — S6991XA Unspecified injury of right wrist, hand and finger(s), initial encounter: Secondary | ICD-10-CM | POA: Diagnosis present

## 2022-04-28 DIAGNOSIS — Y9301 Activity, walking, marching and hiking: Secondary | ICD-10-CM | POA: Insufficient documentation

## 2022-04-28 DIAGNOSIS — W010XXA Fall on same level from slipping, tripping and stumbling without subsequent striking against object, initial encounter: Secondary | ICD-10-CM | POA: Insufficient documentation

## 2022-04-28 DIAGNOSIS — Z79899 Other long term (current) drug therapy: Secondary | ICD-10-CM | POA: Insufficient documentation

## 2022-04-28 MED ORDER — OXYCODONE-ACETAMINOPHEN 5-325 MG PO TABS
1.0000 | ORAL_TABLET | Freq: Once | ORAL | Status: AC
  Administered 2022-04-28: 1 via ORAL
  Filled 2022-04-28: qty 1

## 2022-04-28 MED ORDER — KETOROLAC TROMETHAMINE 10 MG PO TABS: 10.0000 mg | ORAL_TABLET | Freq: Four times a day (QID) | ORAL | 0 refills | Status: AC | PRN

## 2022-04-28 NOTE — ED Provider Notes (Signed)
[facility] EMERGENCY DEPARTMENT Provider Note   CSN: [id number] Arrival date & time: 04/28/22  1819     History  Chief Complaint  Patient presents with   Wrist Pain    Sara Stone is a 51 y.o. female with history of hypothyroidism presents to the emergency department for evaluation of right wrist injury that occurred 2 days ago.  Patient states she was walking out the door when she tripped and fell forward, catching herself on her outstretched hand.  She has swelling and pain to the right wrist and limited range of motion.  She has been taking ibuprofen without significant improvement in symptoms.  She denies numbness and tingling.  She denies head injury or loss of consciousness and all other injuries or complaints.   Wrist Pain       Home Medications Prior to Admission medications   Medication Sig Start Date End Date Taking? Authorizing Provider  ketorolac (TORADOL) 10 MG tablet Take 1 tablet (10 mg total) by mouth every 6 (six) hours as needed. 04/28/22  Yes Janell Quiet, PA-C  ALPRAZolam Prudy Feeler) 0.5 MG tablet Take 0.5 mg by mouth at bedtime as needed for anxiety.    [provider]  beclomethasone (QVAR) 80 MCG/ACT inhaler Inhale into the lungs 2 (two) times daily.    [provider]  DULoxetine (CYMBALTA) 60 MG capsule Take 60 mg by mouth daily.    [provider]  fluticasone (FLONASE) 50 MCG/ACT nasal spray Place 2 sprays into both nostrils daily for 14 days. 01/09/22 02/16/22  Gailen Shelter, PA  ibuprofen (ADVIL) 800 MG tablet Take 1 tablet (800 mg total) by mouth every 8 (eight) hours as needed. 02/25/22   Cornett, Maisie Fus, MD  levothyroxine (SYNTHROID) 50 MCG tablet Take 50 mcg by mouth daily before breakfast.    [provider]  oxyCODONE (OXY IR/ROXICODONE) 5 MG immediate release tablet Take 1 tablet (5 mg total) by mouth every 6 (six) hours as needed for severe pain. 02/25/22   Cornett, Maisie Fus, MD      Allergies     Tramadol and Sulfa antibiotics    Review of Systems   Review of Systems  Constitutional:  Negative for fever.  Musculoskeletal:  Positive for arthralgias and joint swelling. Negative for myalgias and neck stiffness.  Neurological:  Negative for numbness.    Physical Exam Updated Vital Signs BP (!) 118/98 (BP Location: Left Arm)   Pulse 67   Temp 97.9 F (36.6 C) (Oral)   Resp 18   Ht 5\' 8"  (1.727 m)   Wt 106.6 kg   LMP 03/07/2012   SpO2 98%   BMI 35.73 kg/m  Physical Exam Vitals and nursing note reviewed.  Constitutional:      General: She is not in acute distress.    Appearance: She is not ill-appearing.  HENT:     Head: Atraumatic.  Eyes:     Conjunctiva/sclera: Conjunctivae normal.  Cardiovascular:     Rate and Rhythm: Normal rate and regular rhythm.     Pulses: Normal pulses.     Heart sounds: No murmur heard. Pulmonary:     Effort: Pulmonary effort is normal. No respiratory distress.     Breath sounds: Normal breath sounds.  Abdominal:     General: Abdomen is flat. There is no distension.     Palpations: Abdomen is soft.     Tenderness: There is no abdominal tenderness.  Musculoskeletal:     Right wrist: Swelling and tenderness present.  No snuff box tenderness or crepitus. Decreased range of motion.     Cervical back: Normal range of motion.  Skin:    General: Skin is warm and dry.     Capillary Refill: Capillary refill takes less than 2 seconds.  Neurological:     General: No focal deficit present.     Mental Status: She is alert.  Psychiatric:        Mood and Affect: Mood normal.     ED Results / Procedures / Treatments   Labs (all labs ordered are listed, but only abnormal results are displayed) Labs Reviewed - No data to display  EKG None  Radiology DG Wrist Complete Right  Result Date: 04/28/2022 CLINICAL DATA:  Worsening right wrist pain after a fall on Sunday. EXAM: RIGHT WRIST - COMPLETE 3+ VIEW COMPARISON:  None Available. FINDINGS:  Dorsal soft tissue swelling over the right wrist. No evidence of acute fracture or dislocation. No focal bone lesion or bone destruction. Bone cortex appears intact. IMPRESSION: Soft tissue swelling.  No acute bony abnormalities. Electronically Signed   By: William  Stevens M.D.   On: 04/28/2022 18:44    Procedures Procedures    Medications Ordered in ED Medications  oxyCODONE-acetaminophen (PERCOCET/ROXICET) 5-325 MG per tablet 1 tablet (has no administration in time range)    ED Course/ Medical Decision Making/ A&P                           Medical Decision Making Amount and/or Complexity of Data Reviewed Radiology: ordered.  Risk Prescription drug management.   51 year old female presents to the emergency department for evaluation of right wrist injury that occurred 2 days ago.  Differentials include sprain, dislocation, fracture.  Vitals without significant abnormality.  On exam, there is notable swelling to the right wrist with tenderness to palpation across the wrist joint.  No snuffbox tenderness.  Range of motion is limited due to pain and she can only partially supinate her wrist.  I ordered and interpreted x-ray which shows soft tissue swelling without acute fracture.  I agree with radiologist interpretation.  Patient will had her pain management emergency department with 1 Percocet.  She was given a wrist brace and RICE protocol was discussed.  Discharged home with prescription for anti-inflammatories p.o.  All questions were asked and answered and she was discharged home in stable condition.  Final Clinical Impression(s) / ED Diagnoses Final diagnoses:  Sprain of right wrist, initial encounter    Rx / DC Orders ED Discharge Orders          Ordered    ketorolac (TORADOL) 10 MG tablet  Every 6 hours PRN        08 /01/23 1856              12-18-1976, PA-C 04/28/22 06/28/22, MD 04/30/22 2325

## 2022-04-28 NOTE — Discharge Instructions (Signed)
Your x-ray today was negative for fracture or dislocation of your wrist.  You likely sustained a sprain which can be quite painful.  Have given you a brace here in the emergency department and sent you in a prescription for an anti-inflammatory medication.  Continue to ice the area as often as possible 20 minutes on, 20 minutes off.  It can take a couple of weeks before symptoms completely resolve.

## 2022-04-28 NOTE — ED Triage Notes (Signed)
Patient reports she fell on Sunday, c/o worsening right wrist pain today, pain radiates into her hand and her shoulder.

## 2023-09-18 IMAGING — CT CT ANGIO CHEST
2 of 8 series · 18 of 36 positions shown · IV contrast (agent unspecified)
Comparison: 01/09/2022, 03/19/2019

CLINICAL DATA: Chest and back pain, elevated D-dimer, pneumonia

EXAM:
CT ANGIOGRAPHY CHEST WITH CONTRAST
TECHNIQUE: Multidetector CT imaging of the chest was performed using the
standard protocol during bolus administration of intravenous
contrast. Multiplanar CT image reconstructions and MIPs were
obtained to evaluate the vascular anatomy.

[Series 6: pe thins · axial · 0.73mm/px · z∈[-294,-39]mm · 17 of 287 slices shown]
[im 16/287  lung]
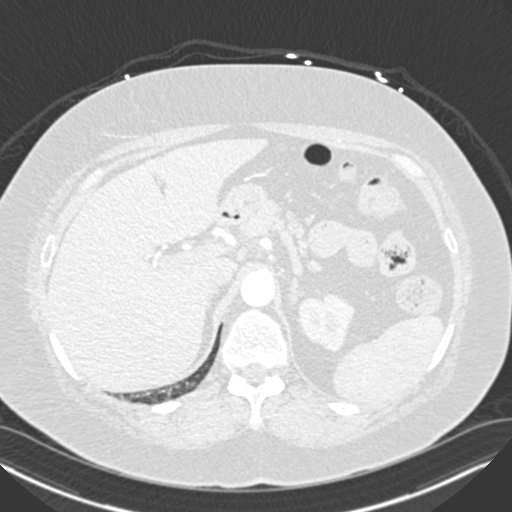
[im 31/287  mediastinal]
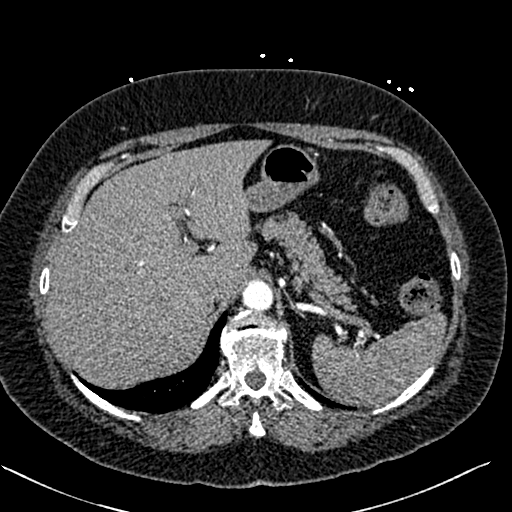
[im 46/287  lung]
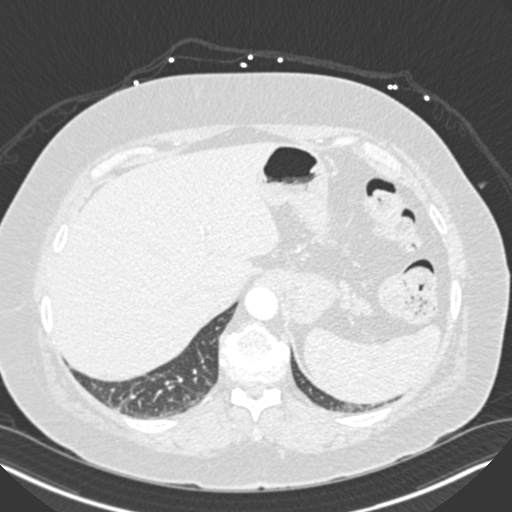
[im 61/287  mediastinal]
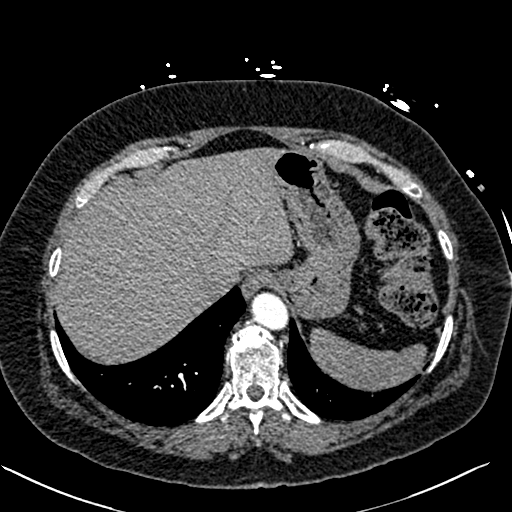
[im 76/287  lung]
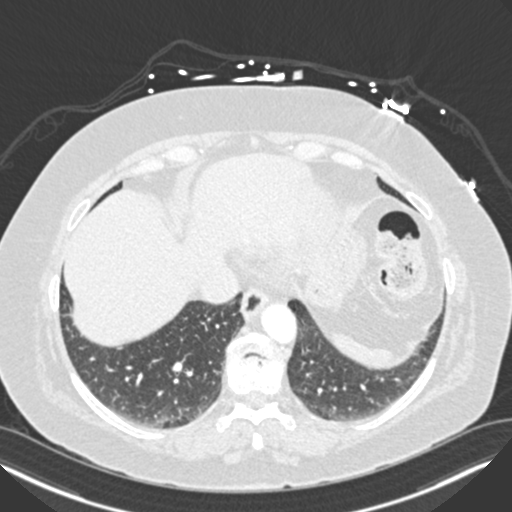
[im 91/287  mediastinal]
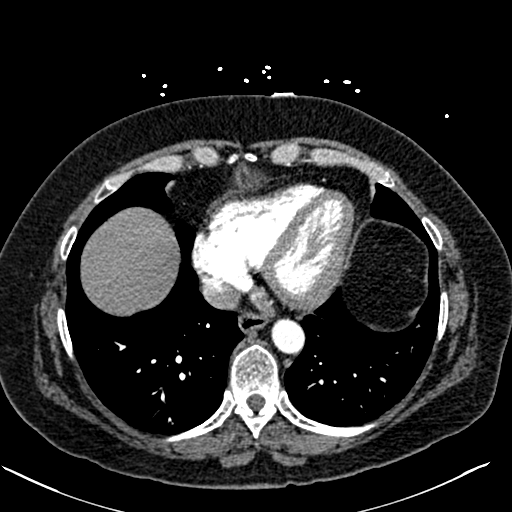
[im 106/287  lung]
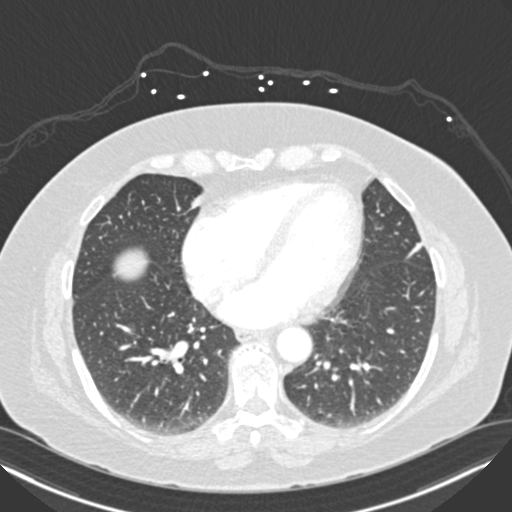
[im 121/287  mediastinal]
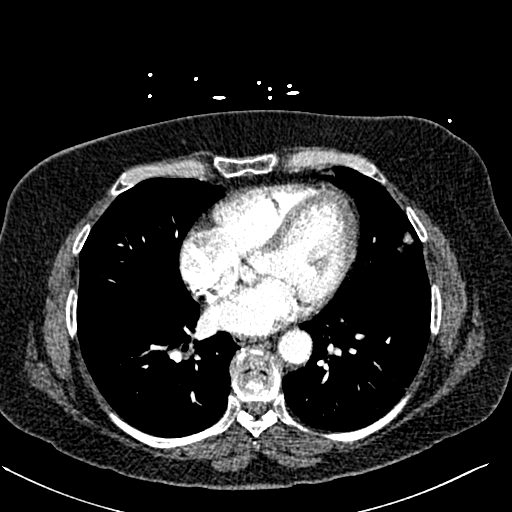
[im 151/287  lung]
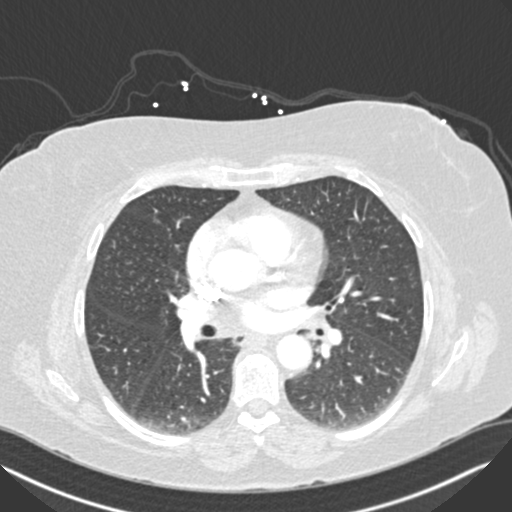
[im 166/287  mediastinal]
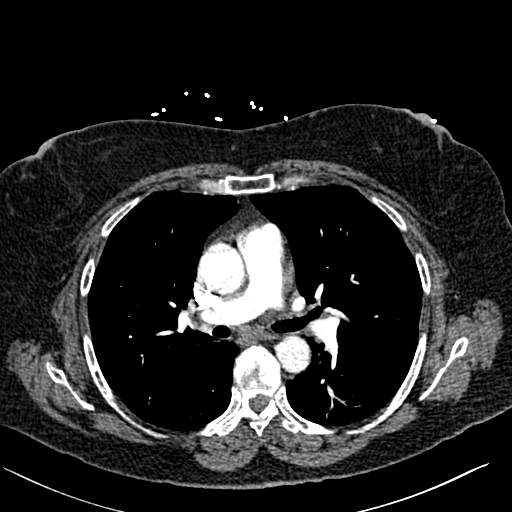
[im 181/287  lung]
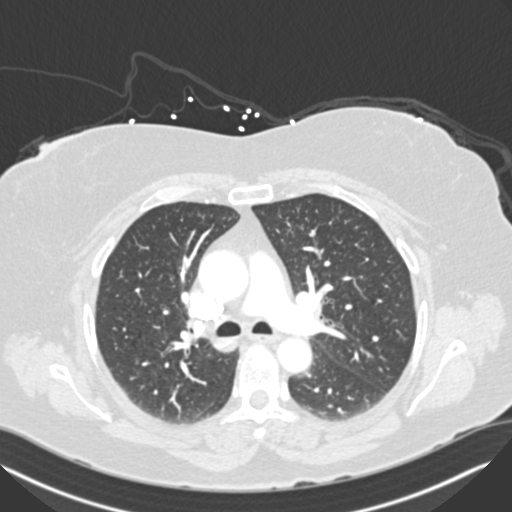
[im 196/287  mediastinal]
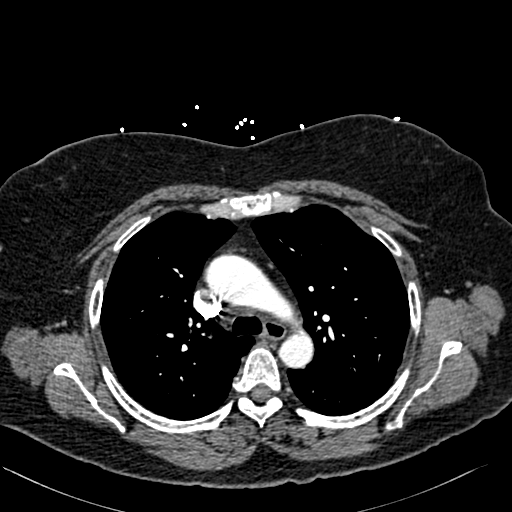
[im 211/287  lung]
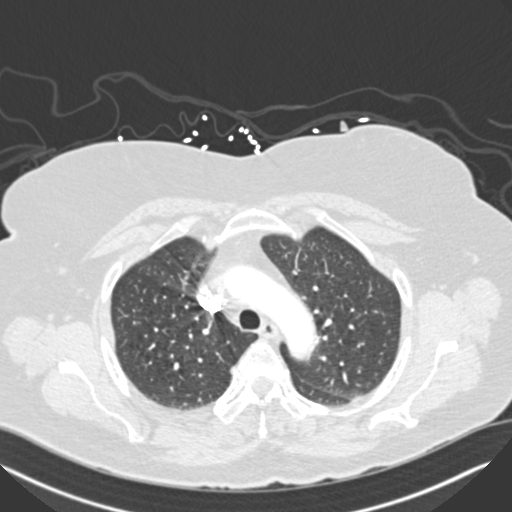
[im 226/287  mediastinal]
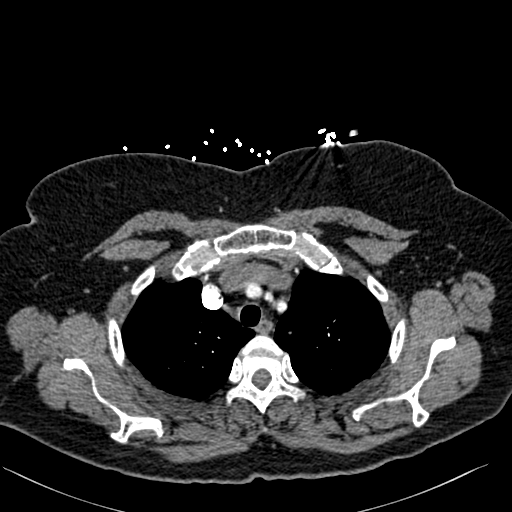
[im 241/287  lung]
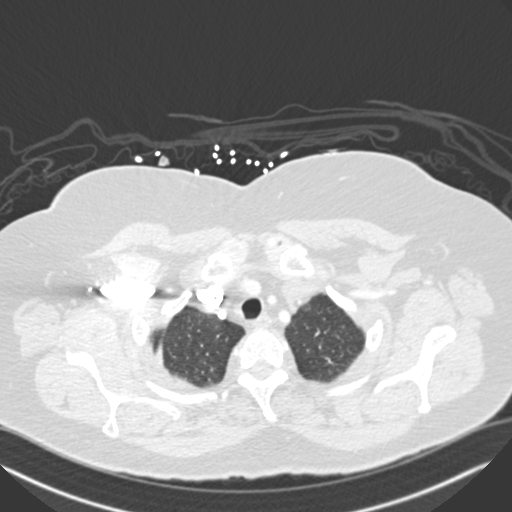
[im 256/287  mediastinal]
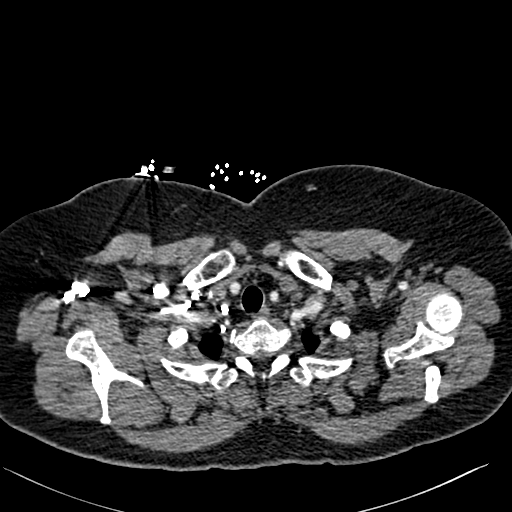
[im 271/287  lung]
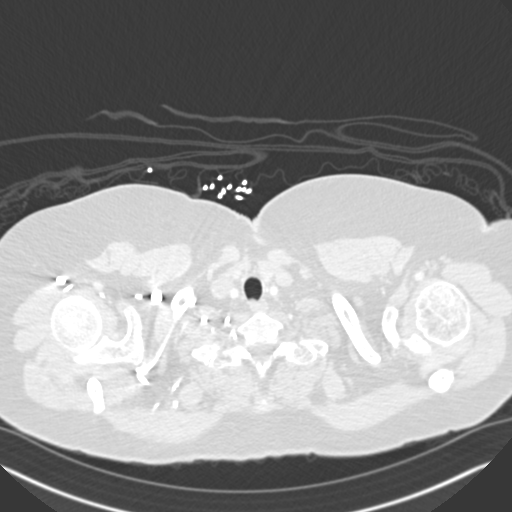

[Series 7: pe coronal mpr · coronal · 0.59mm/px · 1 of 151 slices shown]
[im 76/151  mediastinal]
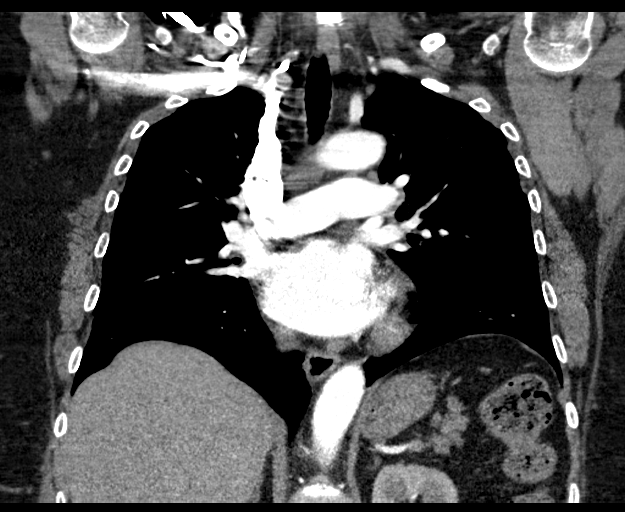

[18 of 36 positions shown; findings below may reference images not displayed]

RADIATION DOSE REDUCTION: This exam was performed according to the
departmental dose-optimization program which includes automated
exposure control, adjustment of the mA and/or kV according to
patient size and/or use of iterative reconstruction technique.

CONTRAST:  75mL OMNIPAQUE IOHEXOL 350 MG/ML SOLN
FINDINGS: Cardiovascular: This is a technically adequate evaluation of the
pulmonary vasculature. No filling defects or pulmonary emboli.

The heart is unremarkable without pericardial effusion. No evidence
of thoracic aortic aneurysm or dissection.

Mediastinum/Nodes: No enlarged mediastinal, hilar, or axillary lymph
nodes. Thyroid gland, trachea, and esophagus demonstrate no
significant findings.

Lungs/Pleura: There are bilateral less than 4 mm pulmonary nodules,
unchanged since 7093 and consistent with benign nodules. Largest
nodules measure 4 mm within the left upper lobe image [DATE] and right
lower lobe image 59/5.

No acute airspace disease, effusion, or pneumothorax. Minimal
subsegmental atelectasis within the lingula. Central airways are
widely patent.

Upper Abdomen: No acute abnormality.

Musculoskeletal: No acute or destructive bony lesions. Reconstructed
images demonstrate no additional findings.

Review of the MIP images confirms the above findings.
IMPRESSION: 1. No acute intrathoracic process.
2. No evidence of pulmonary embolus.
3. Multiple bilateral sub 4 mm pulmonary nodules, unchanged since
7093 and benign. No imaging follow-up is required.
4. Minimal subsegmental atelectasis within the lingula,
corresponding to chest x-ray finding. No acute airspace disease.
